# Patient Record
Sex: Female | Born: 1970 | Race: Black or African American | Hispanic: No | Smoking: Current some day smoker
Health system: Southern US, Community
[De-identification: ages and names within clinical notes are randomized; demographics above are authoritative.]

## PROBLEM LIST (undated history)

## (undated) DIAGNOSIS — K900 Celiac disease: Secondary | ICD-10-CM

## (undated) DIAGNOSIS — D66 Hereditary factor VIII deficiency: Secondary | ICD-10-CM

## (undated) DIAGNOSIS — M4800 Spinal stenosis, site unspecified: Secondary | ICD-10-CM

## (undated) HISTORY — DX: Hereditary factor VIII deficiency: D66

---

## 1998-02-10 ENCOUNTER — Emergency Department (HOSPITAL_COMMUNITY): Admission: EM | Admit: 1998-02-10 | Discharge: 1998-02-10 | Payer: Self-pay | Admitting: Emergency Medicine

## 1998-02-16 ENCOUNTER — Ambulatory Visit (HOSPITAL_COMMUNITY): Admission: RE | Admit: 1998-02-16 | Discharge: 1998-02-16 | Payer: Self-pay | Admitting: Obstetrics & Gynecology

## 1998-03-14 ENCOUNTER — Other Ambulatory Visit: Admission: RE | Admit: 1998-03-14 | Discharge: 1998-03-14 | Payer: Self-pay | Admitting: Obstetrics and Gynecology

## 1998-09-23 ENCOUNTER — Inpatient Hospital Stay (HOSPITAL_COMMUNITY): Admission: AD | Admit: 1998-09-23 | Discharge: 1998-09-23 | Payer: Self-pay | Admitting: Obstetrics and Gynecology

## 1998-09-30 ENCOUNTER — Inpatient Hospital Stay (HOSPITAL_COMMUNITY): Admission: AD | Admit: 1998-09-30 | Discharge: 1998-10-02 | Payer: Self-pay | Admitting: Obstetrics and Gynecology

## 2000-03-13 ENCOUNTER — Encounter: Admission: RE | Admit: 2000-03-13 | Discharge: 2000-03-13 | Payer: Self-pay | Admitting: Family Medicine

## 2000-03-13 ENCOUNTER — Encounter: Payer: Self-pay | Admitting: Family Medicine

## 2003-01-13 ENCOUNTER — Inpatient Hospital Stay (HOSPITAL_COMMUNITY): Admission: AD | Admit: 2003-01-13 | Discharge: 2003-01-13 | Payer: Self-pay | Admitting: Obstetrics and Gynecology

## 2003-02-25 ENCOUNTER — Encounter (INDEPENDENT_AMBULATORY_CARE_PROVIDER_SITE_OTHER): Payer: Self-pay

## 2003-02-25 ENCOUNTER — Inpatient Hospital Stay (HOSPITAL_COMMUNITY): Admission: AD | Admit: 2003-02-25 | Discharge: 2003-02-27 | Payer: Self-pay | Admitting: Obstetrics and Gynecology

## 2003-07-01 ENCOUNTER — Other Ambulatory Visit: Admission: RE | Admit: 2003-07-01 | Discharge: 2003-07-01 | Payer: Self-pay | Admitting: Family Medicine

## 2007-12-11 ENCOUNTER — Other Ambulatory Visit: Admission: RE | Admit: 2007-12-11 | Discharge: 2007-12-11 | Payer: Self-pay | Admitting: Family Medicine

## 2008-01-29 ENCOUNTER — Emergency Department (HOSPITAL_COMMUNITY): Admission: EM | Admit: 2008-01-29 | Discharge: 2008-01-29 | Payer: Self-pay | Admitting: Emergency Medicine

## 2008-10-28 ENCOUNTER — Other Ambulatory Visit: Admission: RE | Admit: 2008-10-28 | Discharge: 2008-10-28 | Payer: Self-pay | Admitting: Family Medicine

## 2008-11-12 ENCOUNTER — Encounter: Admission: RE | Admit: 2008-11-12 | Discharge: 2008-11-12 | Payer: Self-pay | Admitting: Family Medicine

## 2010-08-12 ENCOUNTER — Emergency Department (INDEPENDENT_AMBULATORY_CARE_PROVIDER_SITE_OTHER): Payer: 59

## 2010-08-12 ENCOUNTER — Emergency Department (HOSPITAL_BASED_OUTPATIENT_CLINIC_OR_DEPARTMENT_OTHER)
Admission: EM | Admit: 2010-08-12 | Discharge: 2010-08-12 | Disposition: A | Discharge status: Home / Self Care | Payer: 59 | Attending: Emergency Medicine | Admitting: Emergency Medicine

## 2010-08-12 DIAGNOSIS — M79609 Pain in unspecified limb: Secondary | ICD-10-CM | POA: Insufficient documentation

## 2010-08-12 DIAGNOSIS — S93609A Unspecified sprain of unspecified foot, initial encounter: Secondary | ICD-10-CM | POA: Insufficient documentation

## 2010-08-12 DIAGNOSIS — M25579 Pain in unspecified ankle and joints of unspecified foot: Secondary | ICD-10-CM

## 2010-08-12 DIAGNOSIS — Y929 Unspecified place or not applicable: Secondary | ICD-10-CM | POA: Insufficient documentation
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
## 2011-07-26 ENCOUNTER — Other Ambulatory Visit: Payer: Self-pay | Admitting: Family Medicine

## 2011-07-26 DIAGNOSIS — N946 Dysmenorrhea, unspecified: Secondary | ICD-10-CM

## 2011-07-26 DIAGNOSIS — R1032 Left lower quadrant pain: Secondary | ICD-10-CM

## 2011-08-01 ENCOUNTER — Other Ambulatory Visit: Payer: Self-pay | Admitting: Family Medicine

## 2011-08-01 DIAGNOSIS — Z1231 Encounter for screening mammogram for malignant neoplasm of breast: Secondary | ICD-10-CM

## 2011-08-07 ENCOUNTER — Ambulatory Visit: Payer: 59

## 2011-08-07 ENCOUNTER — Ambulatory Visit
Admission: RE | Admit: 2011-08-07 | Discharge: 2011-08-07 | Disposition: A | Discharge status: Home / Self Care | Payer: 59 | Source: Ambulatory Visit | Attending: Family Medicine | Admitting: Family Medicine

## 2011-08-07 DIAGNOSIS — Z1231 Encounter for screening mammogram for malignant neoplasm of breast: Secondary | ICD-10-CM

## 2011-08-13 ENCOUNTER — Other Ambulatory Visit: Payer: Self-pay | Admitting: Family Medicine

## 2011-08-13 DIAGNOSIS — R928 Other abnormal and inconclusive findings on diagnostic imaging of breast: Secondary | ICD-10-CM

## 2011-08-14 ENCOUNTER — Ambulatory Visit
Admission: RE | Admit: 2011-08-14 | Discharge: 2011-08-14 | Disposition: A | Discharge status: Home / Self Care | Payer: 59 | Source: Ambulatory Visit | Attending: Family Medicine | Admitting: Family Medicine

## 2011-08-14 DIAGNOSIS — N946 Dysmenorrhea, unspecified: Secondary | ICD-10-CM

## 2011-08-14 DIAGNOSIS — R1032 Left lower quadrant pain: Secondary | ICD-10-CM

## 2011-08-21 ENCOUNTER — Other Ambulatory Visit: Payer: 59

## 2011-08-28 ENCOUNTER — Ambulatory Visit
Admission: RE | Admit: 2011-08-28 | Discharge: 2011-08-28 | Disposition: A | Discharge status: Home / Self Care | Payer: 59 | Source: Ambulatory Visit | Attending: Family Medicine | Admitting: Family Medicine

## 2011-08-28 DIAGNOSIS — R928 Other abnormal and inconclusive findings on diagnostic imaging of breast: Secondary | ICD-10-CM

## 2012-08-05 ENCOUNTER — Telehealth: Payer: Self-pay | Admitting: Oncology

## 2012-08-05 NOTE — Telephone Encounter (Signed)
Left several vm to pt to call and set up a np appt. Referring office aware.

## 2012-08-07 ENCOUNTER — Telehealth: Payer: Self-pay | Admitting: Oncology

## 2012-08-07 NOTE — Telephone Encounter (Signed)
S/W PT IN REF TO NP APPT. 08/27/12@3 :00 REFERRING DR Tresa Res DX-FACTOR VIII NEED CLEARANCE FOR SURGERY MAILED NP PACKET

## 2012-08-12 ENCOUNTER — Telehealth: Payer: Self-pay | Admitting: Oncology

## 2012-08-12 NOTE — Telephone Encounter (Signed)
C/D 08/12/12 for appt. 08/27/12 °

## 2012-08-14 ENCOUNTER — Encounter: Payer: Self-pay | Admitting: Oncology

## 2012-08-14 ENCOUNTER — Other Ambulatory Visit: Payer: Self-pay | Admitting: Oncology

## 2012-08-14 DIAGNOSIS — D66 Hereditary factor VIII deficiency: Secondary | ICD-10-CM

## 2012-08-14 HISTORY — DX: Hereditary factor VIII deficiency: D66

## 2012-08-15 ENCOUNTER — Telehealth: Payer: Self-pay | Admitting: Oncology

## 2012-08-15 NOTE — Telephone Encounter (Signed)
New pt for 3/19 w/JG. JG sent order to have lb drawn 3/10. Not able to reach pt - # d/c. email to [facility] and Tiffany.

## 2012-08-18 ENCOUNTER — Other Ambulatory Visit: Payer: 59 | Admitting: Lab

## 2012-08-27 ENCOUNTER — Other Ambulatory Visit: Payer: 59 | Admitting: Lab

## 2012-08-27 ENCOUNTER — Ambulatory Visit: Payer: 59

## 2012-08-27 ENCOUNTER — Encounter: Payer: 59 | Admitting: Oncology

## 2012-09-05 ENCOUNTER — Encounter: Payer: Self-pay | Admitting: *Deleted

## 2012-09-05 ENCOUNTER — Telehealth: Payer: Self-pay | Admitting: *Deleted

## 2012-09-05 NOTE — Telephone Encounter (Signed)
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  Patient did not keep appointment and 30 day letter was mailed 08-08-12.  No patient response, will discharge from practice.

## 2012-10-13 ENCOUNTER — Encounter (HOSPITAL_BASED_OUTPATIENT_CLINIC_OR_DEPARTMENT_OTHER): Payer: Self-pay

## 2012-10-13 ENCOUNTER — Emergency Department (HOSPITAL_BASED_OUTPATIENT_CLINIC_OR_DEPARTMENT_OTHER)
Admission: EM | Admit: 2012-10-13 | Discharge: 2012-10-13 | Disposition: A | Discharge status: Home / Self Care | Payer: 59 | Attending: Emergency Medicine | Admitting: Emergency Medicine

## 2012-10-13 DIAGNOSIS — R05 Cough: Secondary | ICD-10-CM | POA: Insufficient documentation

## 2012-10-13 DIAGNOSIS — R0602 Shortness of breath: Secondary | ICD-10-CM | POA: Insufficient documentation

## 2012-10-13 DIAGNOSIS — R51 Headache: Secondary | ICD-10-CM | POA: Insufficient documentation

## 2012-10-13 DIAGNOSIS — R52 Pain, unspecified: Secondary | ICD-10-CM | POA: Insufficient documentation

## 2012-10-13 DIAGNOSIS — J029 Acute pharyngitis, unspecified: Secondary | ICD-10-CM | POA: Insufficient documentation

## 2012-10-13 DIAGNOSIS — R059 Cough, unspecified: Secondary | ICD-10-CM | POA: Insufficient documentation

## 2012-10-13 DIAGNOSIS — R197 Diarrhea, unspecified: Secondary | ICD-10-CM | POA: Insufficient documentation

## 2012-10-13 DIAGNOSIS — R6883 Chills (without fever): Secondary | ICD-10-CM | POA: Insufficient documentation

## 2012-10-13 DIAGNOSIS — R21 Rash and other nonspecific skin eruption: Secondary | ICD-10-CM | POA: Insufficient documentation

## 2012-10-13 DIAGNOSIS — D66 Hereditary factor VIII deficiency: Secondary | ICD-10-CM | POA: Insufficient documentation

## 2012-10-13 DIAGNOSIS — R110 Nausea: Secondary | ICD-10-CM | POA: Insufficient documentation

## 2012-10-13 DIAGNOSIS — J3489 Other specified disorders of nose and nasal sinuses: Secondary | ICD-10-CM | POA: Insufficient documentation

## 2012-10-13 DIAGNOSIS — Z9189 Other specified personal risk factors, not elsewhere classified: Secondary | ICD-10-CM

## 2012-10-13 DIAGNOSIS — M549 Dorsalgia, unspecified: Secondary | ICD-10-CM | POA: Insufficient documentation

## 2012-10-13 MED ORDER — DOXYCYCLINE HYCLATE 100 MG PO CAPS: 100.0000 mg | ORAL_CAPSULE | Freq: Two times a day (BID) | ORAL | Status: DC

## 2012-10-13 MED ORDER — DOXYCYCLINE HYCLATE 100 MG PO TABS
ORAL_TABLET | ORAL | Status: AC
  Administered 2012-10-13: 100 mg via ORAL
  Filled 2012-10-13: qty 1

## 2012-10-13 MED ORDER — NAPROXEN 500 MG PO TABS: 500.0000 mg | ORAL_TABLET | Freq: Two times a day (BID) | ORAL | Status: DC

## 2012-10-13 MED ORDER — DOXYCYCLINE HYCLATE 100 MG PO TABS
100.0000 mg | ORAL_TABLET | Freq: Once | ORAL | Status: AC
  Administered 2012-10-13: 100 mg via ORAL

## 2012-10-13 NOTE — ED Provider Notes (Signed)
History  This chart was scribed for Courtney Jakes, MD by Shari Heritage, ED Scribe. The patient was seen in room MH03/MH03. Patient's care was started at 1905.   CSN: [id number]  Arrival date & time 10/13/12  1749   First MD Initiated Contact with Patient 10/13/12 1905      Chief Complaint  Patient presents with  . Rash  . Back Pain  . Generalized Body Aches    Patient is a 42 y.o. female presenting with rash. The history is provided by the patient. No language interpreter was used.  Rash Location:  Shoulder/arm Shoulder/arm rash location:  L forearm Quality: itchiness   Context: insect bite/sting   Associated symptoms: diarrhea, headaches, joint pain, myalgias, nausea and shortness of breath   Associated symptoms: no abdominal pain, no fever, no sore throat and not vomiting   Myalgias:    Location:  Generalized   Quality:  Aching   Duration:  2 weeks   Timing:  Constant    HPI Comments: Courtney Warren is a 42 y.o. female who presents to the Emergency Department complaining of generalized body aches and back pain for the past 2 weeks. Patient states that she removed a tick from her left arm about 4 weeks ago at most. She says that the tick appeared while she was camping locally. She states that 1 week following, she started having cold symptoms including congestion, sore throat and productive cough. Patient states that cough lingered and she starting having chills. She says that today she developed itching to her left arm at the site of tick removal. Patient's other symptoms include headaches, diarrhea, nausea and shortness of breath. Patient denies fever, visual changes, vomiting, abdominal pain, chest pain, blood in stool, dysuria, hematuria, no other rash, confusion or hx of bleeding easily. Patient does not smoke or use alcohol.    PCP - Mady Gemma   Past Medical History  Diagnosis Date  . 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 No pertinent past  surgical history.  No family history on file.  History  Substance Use Topics  . Smoking status: Never Smoker   . Smokeless tobacco: Not on file  . Alcohol Use: No    OB History   Grav Para Term Preterm Abortions TAB SAB Ect Mult Living                  Review of Systems  Constitutional: Positive for chills. Negative for fever.  HENT: Positive for congestion and rhinorrhea. Negative for sore throat and neck pain.   Eyes: Negative for visual disturbance.  Respiratory: Positive for cough and shortness of breath.   Cardiovascular: Negative for chest pain and leg swelling.  Gastrointestinal: Positive for nausea and diarrhea. Negative for vomiting, abdominal pain and blood in stool.  Genitourinary: Negative for dysuria and hematuria.  Musculoskeletal: Positive for myalgias, back pain and arthralgias.  Skin: Positive for rash.  Neurological: Positive for headaches.  Hematological: Does not bruise/bleed easily.  Psychiatric/Behavioral: Negative for confusion.    Allergies  Review of patient's allergies indicates no known allergies.  Home Medications   Current Outpatient Rx  Name  Route  Sig  Dispense  Refill  . doxycycline (VIBRAMYCIN) 100 MG capsule   Oral   Take 1 capsule (100 mg total) by mouth 2 (two) times daily.   28 capsule   0   . naproxen (NAPROSYN) 500 MG tablet   Oral   Take 1 tablet (500 mg total) by  mouth 2 (two) times daily.   14 tablet   0     Triage Vitals: BP 135/63  Pulse 69  Temp(Src) 98.4 F (36.9 C) (Oral)  Resp 20  Ht 5\' 11"  (1.803 m)  Wt 210 lb (95.255 kg)  BMI 29.3 kg/m2  SpO2 97%  LMP 10/13/2012  Physical Exam  Constitutional: She is oriented to person, place, and time. She appears well-developed and well-nourished.  HENT:  Head: Normocephalic and atraumatic.  Eyes: Conjunctivae and EOM are normal. Pupils are equal, round, and reactive to light.  Neck: Normal range of motion. Neck supple.  Cardiovascular: Normal rate, regular  rhythm and normal heart sounds.   No murmur heard. Pulmonary/Chest: Effort normal and breath sounds normal. No respiratory distress. She has no wheezes. She has no rales.  Abdominal: Soft. Bowel sounds are normal. There is no tenderness.  Musculoskeletal: Normal range of motion.  Neurological: She is alert and oriented to person, place, and time.  Skin: Skin is warm and dry.    ED Course  Procedures (including critical care time) DIAGNOSTIC STUDIES: Oxygen Saturation is 97% on room air, adequate by my interpretation.    COORDINATION OF CARE: 7:26 PM- Patient informed of current plan for treatment and evaluation and agrees with plan at this time.      Labs Reviewed - No data to display No results found.   1. At high risk for tick borne illness       MDM  Patient symptoms not inconsistent with Lyme's disease. Patient did have a tick exposure approximately 4 weeks or less ago. Patient still has the area of erythema and induration with a tick bit her on the inner part of her left arm. There is no classic erythema migrans rash at this point in time. Patient did not know what in any particular time. However her bodyaches and flulike symptoms could be consistent with Lyme's disease. It also sounds as if she did develop an upper respiratory infection shortly after the tick bite that would be a separate problem. Will initiate patient with treatment of doxycycline and followup with her primary care Dr.      I personally performed the services described in this documentation, which was scribed in my presence. The recorded information has been reviewed and is accurate.     Courtney Jakes, MD 10/13/12 [phone]

## 2012-10-13 NOTE — ED Notes (Addendum)
Pt reports generalized body aches, back pain x 2 weeks.  States she removed a tick from her left arm 4 weeks ago.

## 2013-06-11 DIAGNOSIS — K900 Celiac disease: Secondary | ICD-10-CM

## 2013-06-11 HISTORY — DX: Celiac disease: K90.0

## 2013-12-15 ENCOUNTER — Emergency Department (HOSPITAL_BASED_OUTPATIENT_CLINIC_OR_DEPARTMENT_OTHER)
Admission: EM | Admit: 2013-12-15 | Discharge: 2013-12-16 | Disposition: A | Discharge status: Home / Self Care | Payer: 59 | Attending: Emergency Medicine | Admitting: Emergency Medicine

## 2013-12-15 ENCOUNTER — Encounter (HOSPITAL_BASED_OUTPATIENT_CLINIC_OR_DEPARTMENT_OTHER): Payer: Self-pay | Admitting: Emergency Medicine

## 2013-12-15 DIAGNOSIS — N3000 Acute cystitis without hematuria: Secondary | ICD-10-CM | POA: Insufficient documentation

## 2013-12-15 DIAGNOSIS — Z791 Long term (current) use of non-steroidal anti-inflammatories (NSAID): Secondary | ICD-10-CM | POA: Insufficient documentation

## 2013-12-15 DIAGNOSIS — D66 Hereditary factor VIII deficiency: Secondary | ICD-10-CM | POA: Insufficient documentation

## 2013-12-15 DIAGNOSIS — Z792 Long term (current) use of antibiotics: Secondary | ICD-10-CM | POA: Insufficient documentation

## 2013-12-15 DIAGNOSIS — F17200 Nicotine dependence, unspecified, uncomplicated: Secondary | ICD-10-CM | POA: Insufficient documentation

## 2013-12-15 LAB — URINALYSIS, ROUTINE W REFLEX MICROSCOPIC
BILIRUBIN URINE: NEGATIVE
Glucose, UA: NEGATIVE mg/dL
Ketones, ur: NEGATIVE mg/dL
Nitrite: POSITIVE — AB
PROTEIN: 100 mg/dL — AB
Specific Gravity, Urine: 1.011 (ref 1.005–1.030)
UROBILINOGEN UA: 1 mg/dL (ref 0.0–1.0)
pH: 6 (ref 5.0–8.0)

## 2013-12-15 LAB — URINE MICROSCOPIC-ADD ON

## 2013-12-15 LAB — PREGNANCY, URINE: Preg Test, Ur: NEGATIVE

## 2013-12-15 NOTE — ED Notes (Signed)
Urinary sxs and back pain since yesterday, denies fever

## 2013-12-16 MED ORDER — CIPROFLOXACIN HCL 500 MG PO TABS
ORAL_TABLET | ORAL | Status: AC
  Filled 2013-12-16: qty 1

## 2013-12-16 MED ORDER — PHENAZOPYRIDINE HCL 100 MG PO TABS
ORAL_TABLET | ORAL | Status: AC
  Filled 2013-12-16: qty 2

## 2013-12-16 MED ORDER — CIPROFLOXACIN HCL 500 MG PO TABS: 500.0000 mg | ORAL_TABLET | Freq: Two times a day (BID) | ORAL | Status: DC

## 2013-12-16 MED ORDER — CIPROFLOXACIN HCL 500 MG PO TABS
500.0000 mg | ORAL_TABLET | Freq: Once | ORAL | Status: AC
  Administered 2013-12-16: 500 mg via ORAL

## 2013-12-16 MED ORDER — PHENAZOPYRIDINE HCL 200 MG PO TABS: 200.0000 mg | ORAL_TABLET | Freq: Three times a day (TID) | ORAL | Status: DC

## 2013-12-16 MED ORDER — PHENAZOPYRIDINE HCL 100 MG PO TABS
200.0000 mg | ORAL_TABLET | Freq: Once | ORAL | Status: AC
  Administered 2013-12-16: 200 mg via ORAL

## 2013-12-16 NOTE — Discharge Instructions (Signed)
Urinary Tract Infection Urinary tract infections (UTIs) can develop anywhere along your urinary tract. Your urinary tract is your body's drainage system for removing wastes and extra water. Your urinary tract includes two kidneys, two ureters, a bladder, and a urethra. Your kidneys are a pair of bean-shaped organs. Each kidney is about the size of your fist. They are located below your ribs, one on each side of your spine. CAUSES Infections are caused by microbes, which are microscopic organisms, including fungi, viruses, and bacteria. These organisms are so small that they can only be seen through a microscope. Bacteria are the microbes that most commonly cause UTIs. SYMPTOMS  Symptoms of UTIs may vary by age and gender of the patient and by the location of the infection. Symptoms in young women typically include a frequent and intense urge to urinate and a painful, burning feeling in the bladder or urethra during urination. Older women and men are more likely to be tired, shaky, and weak and have muscle aches and abdominal pain. A fever may mean the infection is in your kidneys. Other symptoms of a kidney infection include pain in your back or sides below the ribs, nausea, and vomiting. DIAGNOSIS To diagnose a UTI, your caregiver will ask you about your symptoms. Your caregiver also will ask to provide a urine sample. The urine sample will be tested for bacteria and white blood cells. White blood cells are made by your body to help fight infection. TREATMENT  Typically, UTIs can be treated with medication. Because most UTIs are caused by a bacterial infection, they usually can be treated with the use of antibiotics. The choice of antibiotic and length of treatment depend on your symptoms and the type of bacteria causing your infection. HOME CARE INSTRUCTIONS  If you were prescribed antibiotics, take them exactly as your caregiver instructs you. Finish the medication even if you feel better after you  have only taken some of the medication.  Drink enough water and fluids to keep your urine clear or pale yellow.  Avoid caffeine, tea, and carbonated beverages. They tend to irritate your bladder.  Empty your bladder often. Avoid holding urine for long periods of time.  Empty your bladder before and after sexual intercourse.  After a bowel movement, women should cleanse from front to back. Use each tissue only once.  SEEK IMMEDIATE MEDICAL CARE IF:   You have severe back pain or lower abdominal pain.  You develop chills.  You have nausea or vomiting.  You have continued burning or discomfort with urination. MAKE SURE YOU:   Understand these instructions.  Will watch your condition.  Will get help right away if you are not doing well or get worse. Document Released: 03/07/2005 Document Revised: 11/27/2011 Document Reviewed: 07/06/2011 [facility] Patient Information 2015 [place], [place]. This information is not intended to replace advice given to you by your health care provider. Make sure you discuss any questions you have with your health care provider.

## 2013-12-16 NOTE — ED Provider Notes (Addendum)
CSN: [id number]     Arrival date & time 12/15/13  2251 History   First MD Initiated Contact with Patient 12/16/13 0046     Chief Complaint  Patient presents with  . Urinary Frequency     (Consider location/radiation/quality/duration/timing/severity/associated sxs/prior Treatment) HPI This is a 43 year old female with a two-day history of urinary urgency, urinary frequency, voiding small amounts and burning with urination. Symptoms are moderate but worsening. She has not gotten relief with over-the-counter ASO. Symptoms have been associated with chills but no fever. She denies nausea, vomiting or diarrhea. She is having back pain and pain in her flanks, right greater than left. She is only had one urinary tract infection in the past but states this is similar.  Past Medical History  Diagnosis Date  . 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 No pertinent past surgical history. History reviewed. No pertinent family history. History  Substance Use Topics  . Smoking status: Current Some Day Smoker    Types: Cigars  . Smokeless tobacco: Not on file  . Alcohol Use: No   OB History   Grav Para Term Preterm Abortions TAB SAB Ect Mult Living                 Review of Systems  All other systems reviewed and are negative.   Allergies  Review of patient's allergies indicates no known allergies.  Home Medications   Prior to Admission medications   Medication Sig Start Date End Date Taking? Authorizing Provider  etonogestrel-ethinyl estradiol (NUVARING) 0.12-0.015 MG/24HR vaginal ring Place 1 each vaginally every 28 (twenty-eight) days. Insert vaginally and leave in place for 3 consecutive weeks, then remove for 1 week.   Yes Historical Provider, MD  doxycycline (VIBRAMYCIN) 100 MG capsule Take 1 capsule (100 mg total) by mouth 2 (two) times daily. 10/13/12   Vanetta MuldersScott Zackowski, MD  naproxen (NAPROSYN) 500 MG tablet Take 1 tablet (500 mg total) by mouth 2 (two) times daily. 10/13/12    Vanetta MuldersScott Zackowski, MD   BP 118/66  Pulse 76  Temp(Src) 98.6 F (37 C) (Oral)  Resp 20  Ht 5\' 10"  (1.778 m)  Wt 195 lb (88.451 kg)  BMI 27.98 kg/m2  SpO2 98%  LMP 11/15/2013  Physical Exam General: Well-developed, well-nourished female in no acute distress; appearance consistent with age of record HENT: normocephalic; atraumatic Eyes: pupils equal, round and reactive to light; extraocular muscles intact Neck: supple Heart: regular rate and rhythm Lungs: clear to auscultation bilaterally Abdomen: soft; nondistended; suprapubic tenderness; no masses or hepatosplenomegaly; bowel sounds present GU: CVA tenderness right greater than left Extremities: No deformity; full range of motion; pulses normal Neurologic: Awake, alert and oriented; motor function intact in all extremities and symmetric; no facial droop Skin: Warm and dry Psychiatric: Normal mood and affect    ED Course  Procedures (including critical care time)  MDM   Nursing notes and vitals signs, including pulse oximetry, reviewed.  Summary of this visit's results, reviewed by myself:  Labs:  Results for orders placed during the hospital encounter of 12/15/13 (from the past 24 hour(s))  URINALYSIS, ROUTINE W REFLEX MICROSCOPIC     Status: Abnormal   Collection Time    12/15/13 11:17 PM      Result Value Ref Range   Color, Urine ORANGE (*) YELLOW   APPearance TURBID (*) CLEAR   Specific Gravity, Urine 1.011  1.005 - 1.030   pH 6.0  5.0 - 8.0   Glucose, UA NEGATIVE  NEGATIVE  mg/dL   Hgb urine dipstick MODERATE (*) NEGATIVE   Bilirubin Urine NEGATIVE  NEGATIVE   Ketones, ur NEGATIVE  NEGATIVE mg/dL   Protein, ur 161100 (*) NEGATIVE mg/dL   Urobilinogen, UA 1.0  0.0 - 1.0 mg/dL   Nitrite POSITIVE (*) NEGATIVE   Leukocytes, UA LARGE (*) NEGATIVE  PREGNANCY, URINE     Status: None   Collection Time    12/15/13 11:17 PM      Result Value Ref Range   Preg Test, Ur NEGATIVE  NEGATIVE  URINE MICROSCOPIC-ADD ON      Status: Abnormal   Collection Time    12/15/13 11:17 PM      Result Value Ref Range   Squamous Epithelial / LPF RARE  RARE   WBC, UA TOO NUMEROUS TO COUNT  <3 WBC/hpf   RBC / HPF 3-6  <3 RBC/hpf   Bacteria, UA MANY (*) RARE   Urine-Other MUCOUS PRESENT     Will treat for early pyelonephritis.     Hanley SeamenJohn L August Gosser, MD 12/16/13 [id number]  Hanley SeamenJohn L Genesia Caslin, MD 12/16/13 [id number]

## 2013-12-18 LAB — URINE CULTURE

## 2015-06-12 DIAGNOSIS — M4800 Spinal stenosis, site unspecified: Secondary | ICD-10-CM

## 2015-06-12 HISTORY — DX: Spinal stenosis, site unspecified: M48.00

## 2016-02-26 ENCOUNTER — Encounter (HOSPITAL_BASED_OUTPATIENT_CLINIC_OR_DEPARTMENT_OTHER): Payer: Self-pay | Admitting: *Deleted

## 2016-02-26 ENCOUNTER — Emergency Department (HOSPITAL_BASED_OUTPATIENT_CLINIC_OR_DEPARTMENT_OTHER): Payer: Managed Care, Other (non HMO)

## 2016-02-26 ENCOUNTER — Emergency Department (HOSPITAL_BASED_OUTPATIENT_CLINIC_OR_DEPARTMENT_OTHER)
Admission: EM | Admit: 2016-02-26 | Discharge: 2016-02-26 | Disposition: A | Discharge status: Home / Self Care | Payer: Managed Care, Other (non HMO) | Attending: Emergency Medicine | Admitting: Emergency Medicine

## 2016-02-26 DIAGNOSIS — Y9389 Activity, other specified: Secondary | ICD-10-CM | POA: Insufficient documentation

## 2016-02-26 DIAGNOSIS — Z79899 Other long term (current) drug therapy: Secondary | ICD-10-CM | POA: Insufficient documentation

## 2016-02-26 DIAGNOSIS — Z79891 Long term (current) use of opiate analgesic: Secondary | ICD-10-CM | POA: Diagnosis not present

## 2016-02-26 DIAGNOSIS — Y92019 Unspecified place in single-family (private) house as the place of occurrence of the external cause: Secondary | ICD-10-CM | POA: Insufficient documentation

## 2016-02-26 DIAGNOSIS — W010XXA Fall on same level from slipping, tripping and stumbling without subsequent striking against object, initial encounter: Secondary | ICD-10-CM | POA: Diagnosis not present

## 2016-02-26 DIAGNOSIS — Y999 Unspecified external cause status: Secondary | ICD-10-CM | POA: Diagnosis not present

## 2016-02-26 DIAGNOSIS — F17290 Nicotine dependence, other tobacco product, uncomplicated: Secondary | ICD-10-CM | POA: Diagnosis not present

## 2016-02-26 DIAGNOSIS — S8391XA Sprain of unspecified site of right knee, initial encounter: Secondary | ICD-10-CM | POA: Insufficient documentation

## 2016-02-26 DIAGNOSIS — S8991XA Unspecified injury of right lower leg, initial encounter: Secondary | ICD-10-CM | POA: Diagnosis present

## 2016-02-26 HISTORY — DX: Celiac disease: K90.0

## 2016-02-26 HISTORY — DX: Spinal stenosis, site unspecified: M48.00

## 2016-02-26 MED ORDER — OXYCODONE-ACETAMINOPHEN 5-325 MG PO TABS
1.0000 | ORAL_TABLET | Freq: Once | ORAL | Status: AC
  Administered 2016-02-26: 1 via ORAL
  Filled 2016-02-26: qty 1

## 2016-02-26 NOTE — Discharge Instructions (Signed)
Rest, ice and elevated your knee. Wear a knee immobilizer as needed for comfort. Use crutches at need for comfort. Take NSAIDS and tylenol as needed for pain. Please follow up with your primary care doctor if symptoms worsen. Return to the ED if your leg becomes cold, you lose feeling, or the swelling increases.

## 2016-02-26 NOTE — ED Provider Notes (Signed)
[facility]   CSN: [id number] Arrival date & time: 02/26/16  4098     History   Chief Complaint Chief Complaint  Patient presents with  . Knee Injury    HPI Courtney Warren is a 45 y.o. female.  45 year old African-American female no significant past medical history presents to the ED this morning with right knee pain. Patient states that she was painting her house on Friday when she tripped on the plastic and cord and fell onto both knees. Patient has been ambulating since with pain. However today the swelling increased and she was unable to bend her right knee. She has tried Aleve and Tylenol with little relief. Walking makes the pain worse. Nothing makes better. Denies any pain in right hip or right ankle. Denies any other complaints.      Past Medical History:  Diagnosis Date  . Celiac disease 2015  . 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 Spinal stenosis 2017    Patient Active Problem List   Diagnosis Date Noted  . 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 No pertinent surgical history.  OB History    No data available       Home Medications    Prior to Admission medications   Medication Sig Start Date End Date Taking? Authorizing Provider  naproxen (NAPROSYN) 500 MG tablet Take 1 tablet (500 mg total) by mouth 2 (two) times daily. 10/13/12  Yes Vanetta Mulders, MD  traMADol (ULTRAM) 50 MG tablet Take 50 mg by mouth every 6 (six) hours as needed.   Yes Historical Provider, MD  ciprofloxacin (CIPRO) 500 MG tablet Take 1 tablet (500 mg total) by mouth 2 (two) times daily. 12/16/13   John Molpus, MD  doxycycline (VIBRAMYCIN) 100 MG capsule Take 1 capsule (100 mg total) by mouth 2 (two) times daily. 10/13/12   Vanetta Mulders, MD  etonogestrel-ethinyl estradiol (NUVARING) 0.12-0.015 MG/24HR vaginal ring Place 1 each vaginally every 28 (twenty-eight) days. Insert vaginally and leave in place for 3 consecutive weeks, then  remove for 1 week.    Historical Provider, MD  phenazopyridine (PYRIDIUM) 200 MG tablet Take 1 tablet (200 mg total) by mouth 3 (three) times daily. 12/16/13   Paula Libra, MD    Family History No family history on file.  Social History Social History  Substance Use Topics  . Smoking status: Current Some Day Smoker    Types: Cigars  . Smokeless tobacco: Never Used  . Alcohol use Yes     Comment: weekly     Allergies   Review of patient's allergies indicates no known allergies.   Review of Systems Review of Systems  Constitutional: Negative for chills and fever.  Respiratory: Negative for cough and shortness of breath.   Cardiovascular: Negative for chest pain, palpitations and leg swelling.  Gastrointestinal: Negative for abdominal pain, diarrhea, nausea and vomiting.  Genitourinary: Negative for flank pain, frequency and urgency.  Musculoskeletal: Positive for joint swelling.  Skin: Negative for color change, rash and wound.  Neurological: Negative for dizziness, weakness, light-headedness, numbness and headaches.     Physical Exam Updated Vital Signs BP 125/82 (BP Location: Left Arm)   Pulse 75   Temp 98.6 F (37 C) (Oral)   Resp 18   Ht 5\' 10"  (1.778 m)   Wt 98.4 kg   LMP 02/21/2016 (Exact Date)   SpO2 100%   BMI 31.14 kg/m   Physical Exam  Constitutional: She appears well-developed and well-nourished. No  distress.  Eyes: Right eye exhibits no discharge. Left eye exhibits no discharge. No scleral icterus.  Cardiovascular:  Pulses:      Dorsalis pedis pulses are 2+ on the right side, and 2+ on the left side.  Pulmonary/Chest: No respiratory distress.  Musculoskeletal:       Right knee: She exhibits decreased range of motion and swelling. She exhibits no effusion, no ecchymosis, no deformity, no laceration, no erythema, no LCL laxity, normal patellar mobility and normal meniscus. Tenderness found. Patellar tendon tenderness noted.  Neurological: She is alert.  No sensory deficit.  Skin: Skin is warm and dry. Capillary refill takes less than 2 seconds. No erythema. No pallor.  Nursing note and vitals reviewed.    ED Treatments / Results  Labs (all labs ordered are listed, but only abnormal results are displayed) Labs Reviewed - No data to display  EKG  EKG Interpretation None       Radiology Dg Knee Complete 4 Views Right  Result Date: 02/26/2016 CLINICAL DATA:  Pt states she was painting this past Friday when she tripped. Reports falling on hands and knees. Presents with RIGHT anterior patellar knee pain. Reports some numbness/tingling. Able to ambulate with pain. Reports she can kick l.*comment was truncated* EXAM: RIGHT KNEE - COMPLETE 4+ VIEW COMPARISON:  None FINDINGS: No fracture of the proximal tibia or distal femur. Patella is normal. No joint effusion. IMPRESSION: No fracture or dislocation.  For Electronically Signed   By: Genevive BiStewart  Edmunds M.D.   On: 02/26/2016 10:49    Procedures Procedures (including critical care time)  Medications Ordered in ED Medications  oxyCODONE-acetaminophen (PERCOCET/ROXICET) 5-325 MG per tablet 1 tablet (not administered)     Initial Impression / Assessment and Plan / ED Course  I have reviewed the triage vital signs and the nursing notes.  Pertinent labs & imaging results that were available during my care of the patient were reviewed by me and considered in my medical decision making (see chart for details).  Clinical Course  Patient X-Ray negative for obvious fracture or dislocation. Pain managed in ED. Pt advised to follow up with orthopedics if symptoms persist for possibility of missed fracture diagnosis or ligament injury. Patient given brace while in ED, conservative therapy recommended and discussed. Patient will be dc home & is agreeable with above plan. Hemodynamically stable. Discharged home in NAD with stable VS. Strict return precautions given including education on compartment  syndrome.  Final Clinical Impressions(s) / ED Diagnoses   Final diagnoses:  Right knee sprain, initial encounter    New Prescriptions Discharge Medication List as of 02/26/2016 11:22 AM       Rise MuKenneth T Leaphart, PA-C 02/27/16 [phone]    Marily MemosJason Mesner, MD 02/27/16 1049

## 2016-02-26 NOTE — ED Triage Notes (Signed)
Pt reports she was painting this past Friday (indoors) when she tripped. Reports falling on hands and knees. Presents with R knee pain. Reports some numbness/tingling (pedal pulse intact; cap refill <2secs). Able to ambulate with pain. Reports she can kick leg forward but is unable to bend back.

## 2017-05-17 IMAGING — DX DG KNEE COMPLETE 4+V*R*
4 series · 4 of 4 positions shown · non-contrast
Comparison: None

CLINICAL DATA: Pt states she was painting this past [REDACTED] when she
tripped. Reports falling on hands and knees. Presents with RIGHT
anterior patellar knee pain. Reports some numbness/tingling. Able to
ambulate with pain. Reports she can Carl-Albin Murul...*comment was truncated*

EXAM:
RIGHT KNEE - COMPLETE 4+ VIEW

[knee ap]
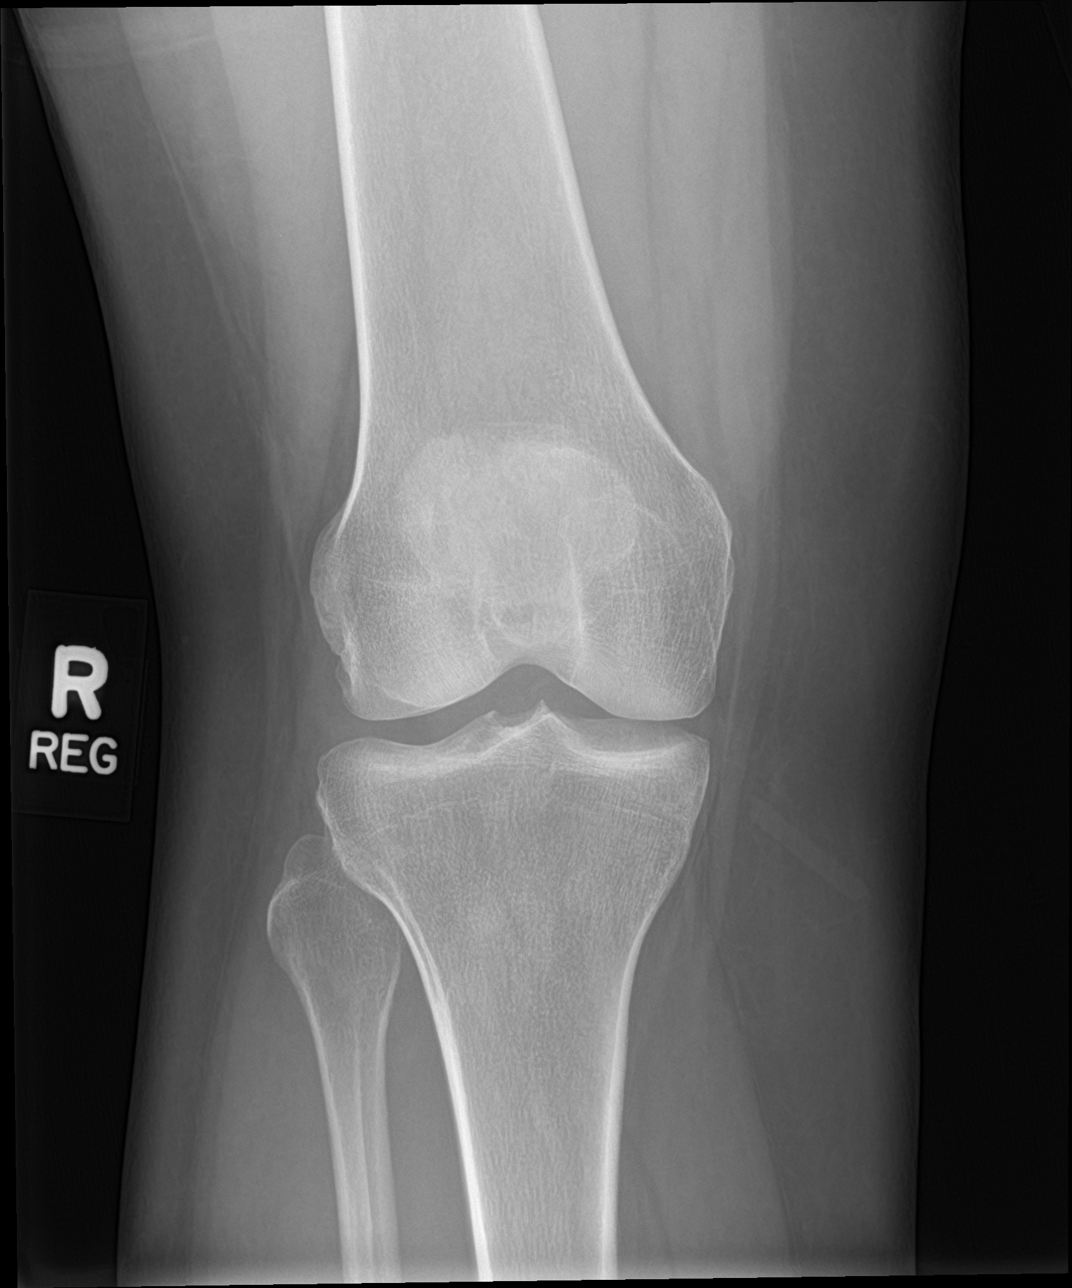

[knee lat]
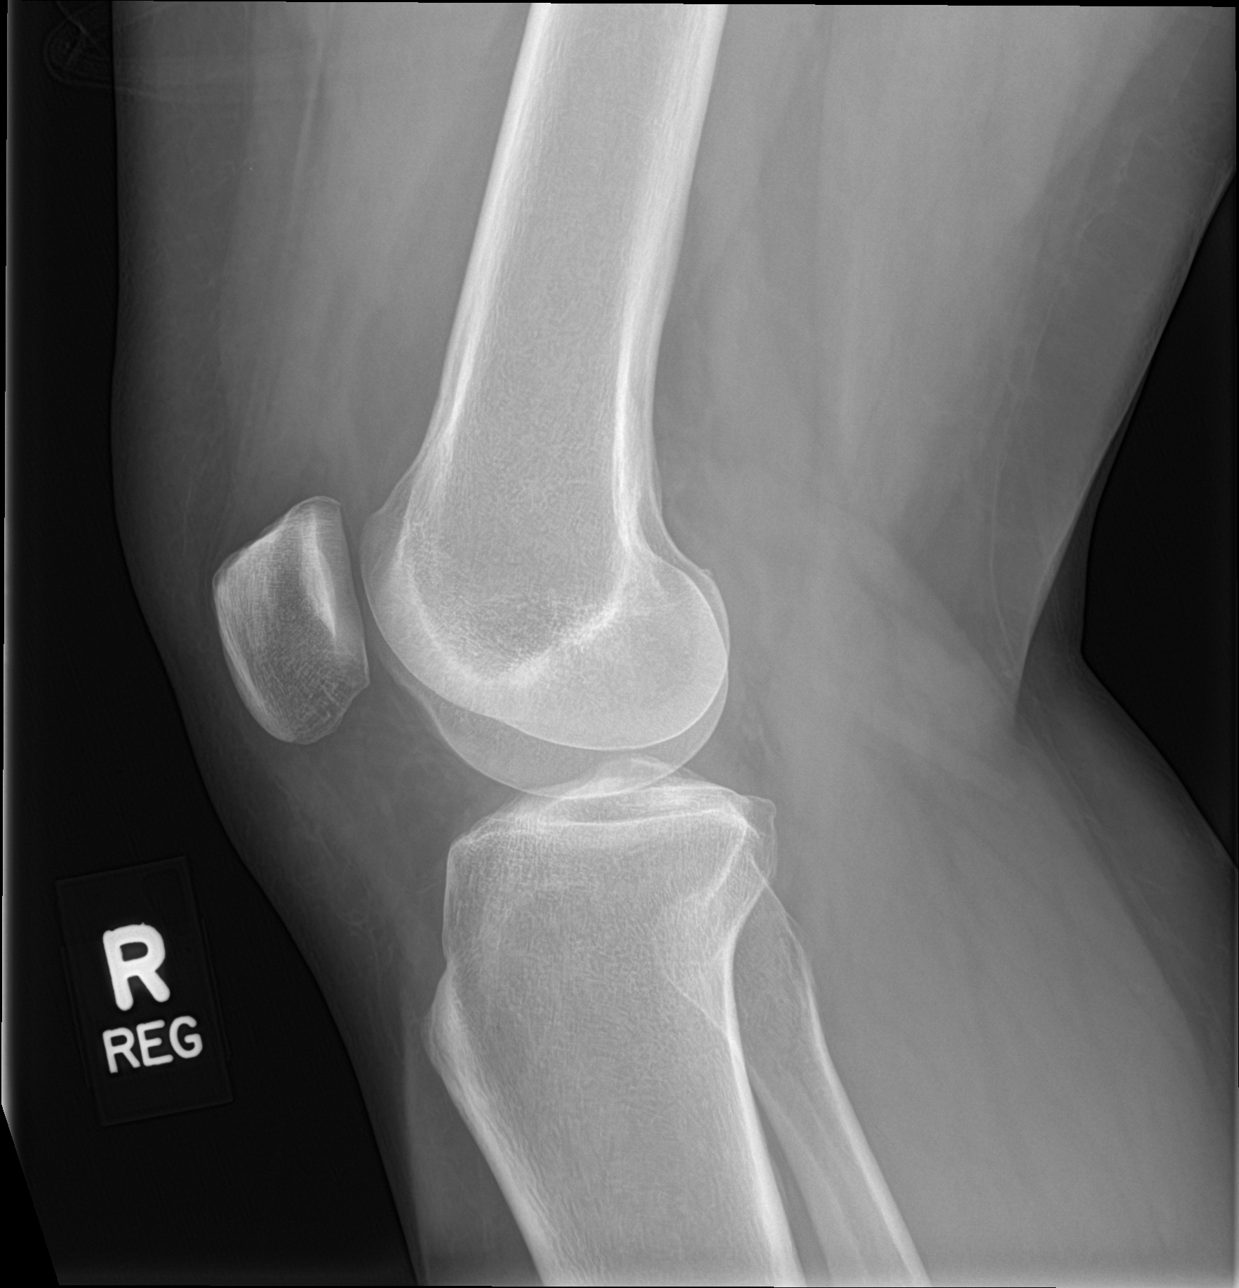

[knee obl (1 of 2)]
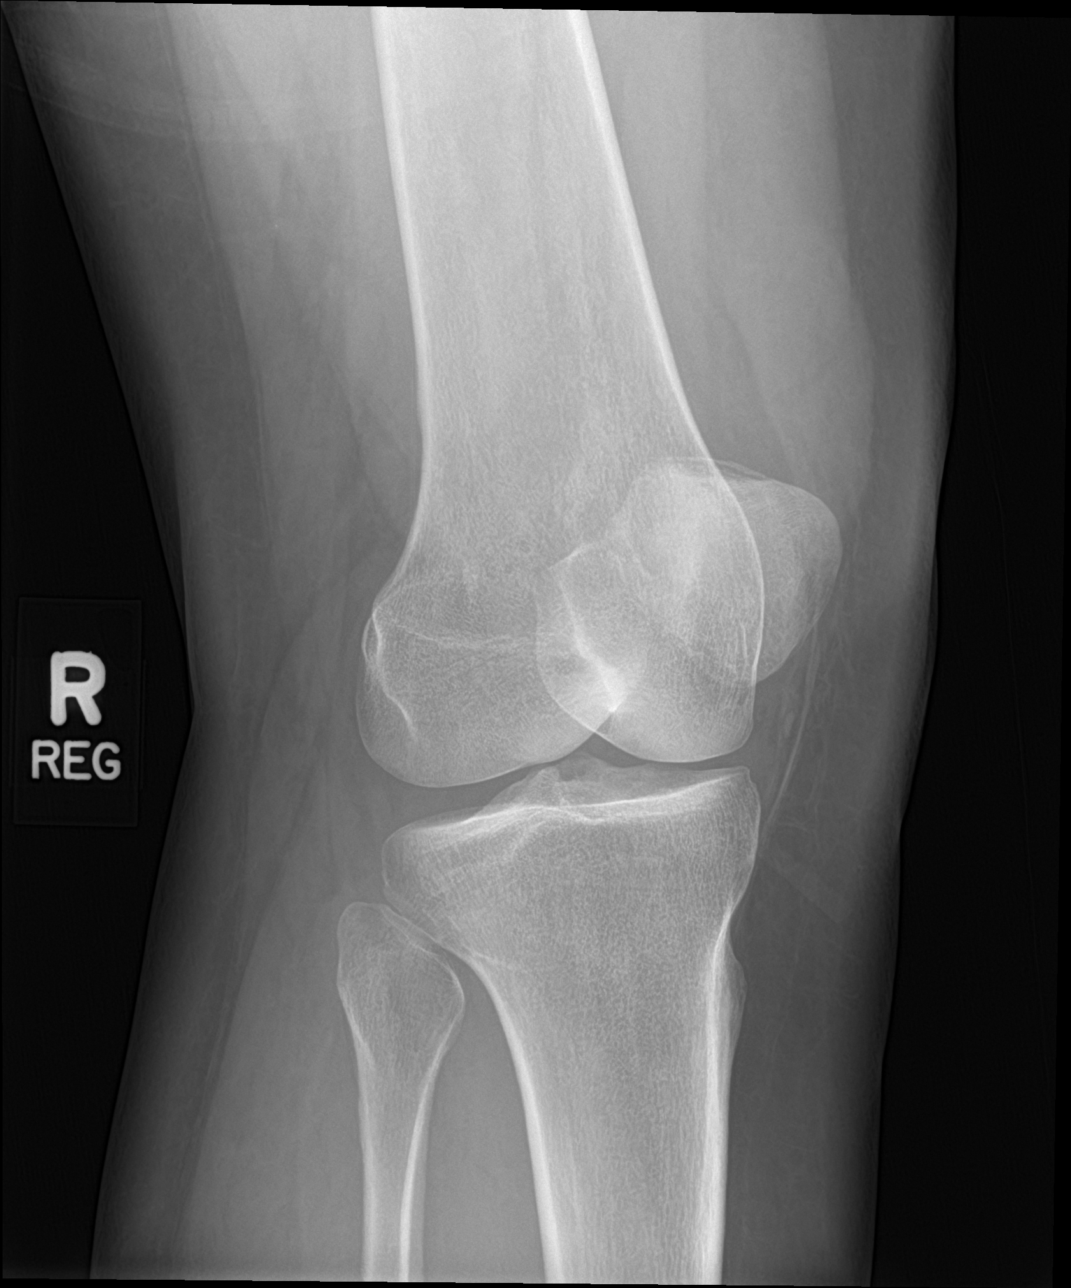

[knee obl (2 of 2)]
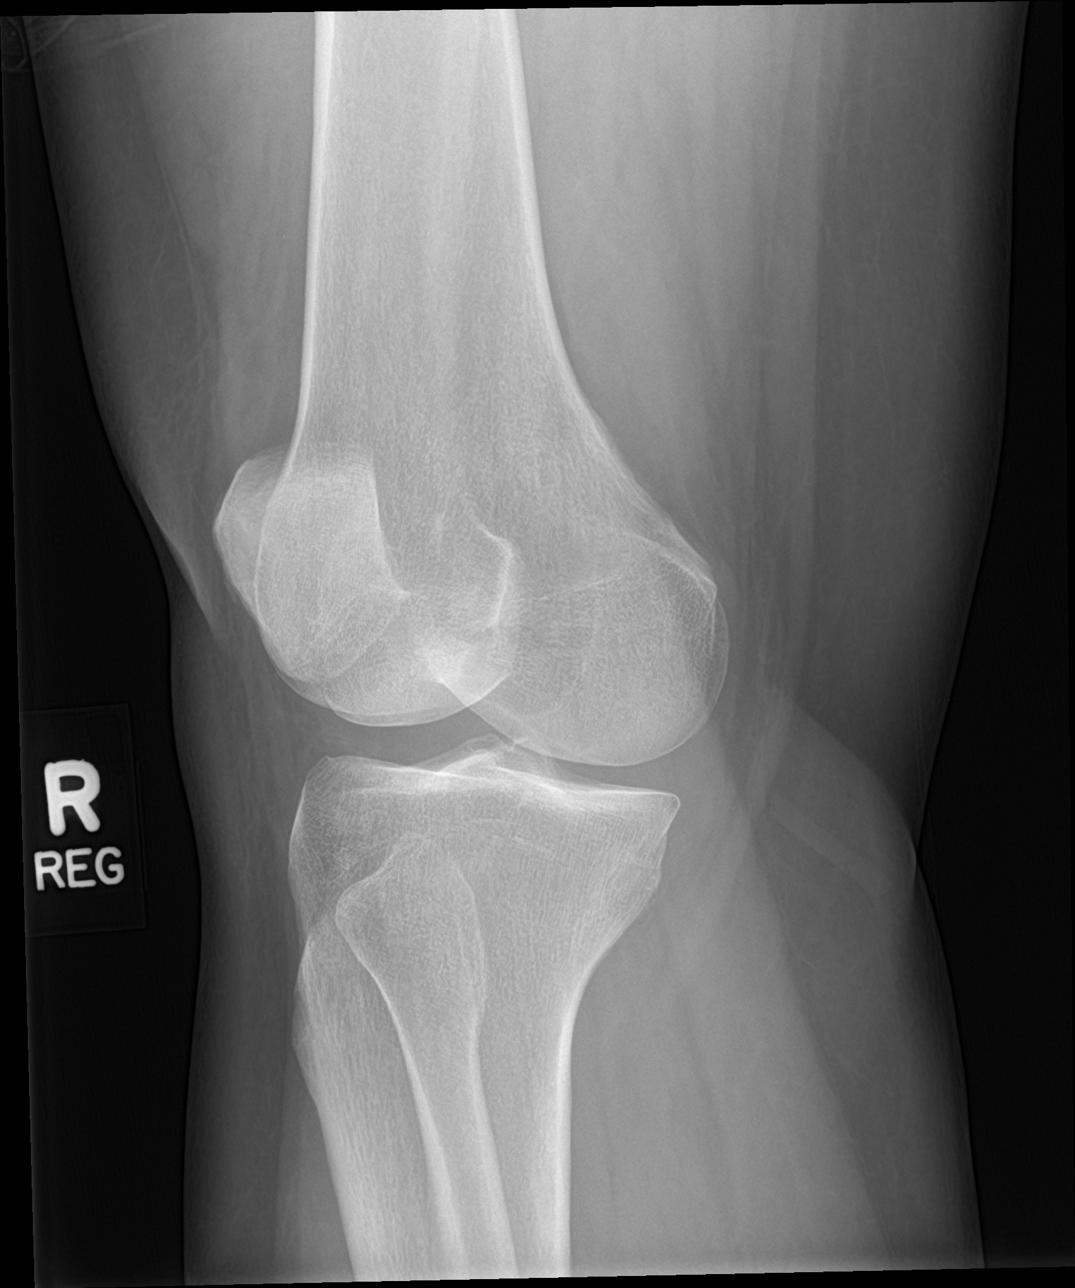

[4 of 4 positions shown; findings below may reference images not displayed]

FINDINGS: No fracture of the proximal tibia or distal femur. Patella is
normal. No joint effusion.
IMPRESSION: No fracture or dislocation.  For

## 2018-07-14 ENCOUNTER — Ambulatory Visit (INDEPENDENT_AMBULATORY_CARE_PROVIDER_SITE_OTHER): Payer: 59 | Admitting: Gynecology

## 2018-07-14 ENCOUNTER — Encounter: Payer: Self-pay | Admitting: Gynecology

## 2018-07-14 VITALS — BP 122/78 | Ht 71.0 in | Wt 237.0 lb

## 2018-07-14 DIAGNOSIS — R87810 Cervical high risk human papillomavirus (HPV) DNA test positive: Secondary | ICD-10-CM | POA: Diagnosis not present

## 2018-07-14 DIAGNOSIS — N926 Irregular menstruation, unspecified: Secondary | ICD-10-CM | POA: Diagnosis not present

## 2018-07-14 DIAGNOSIS — Z01419 Encounter for gynecological examination (general) (routine) without abnormal findings: Secondary | ICD-10-CM | POA: Diagnosis not present

## 2018-07-14 NOTE — Addendum Note (Signed)
Addended by: Dayna Barker on: 07/14/2018 04:45 PM   Modules accepted: Orders

## 2018-07-14 NOTE — Patient Instructions (Signed)
Follow-up for the ultrasound as scheduled. 

## 2018-07-14 NOTE — Progress Notes (Signed)
    Courtney Warren Oct 19, 1970 [id number]        48 y.o.  [id number] new patient for annual gynecologic exam.  Also complaining of:  1. Irregular bleeding.  On NuvaRing contraception with sporadic bleeding intermittently.  Had used Mirena IUDs in the past.  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 2. Pap smear 11/2016 showed normal cytology but positive high risk HPV.  No history of that before.  Past medical history,surgical history, problem list, medications, allergies, family history and social history were all reviewed and documented as reviewed in the EPIC chart.  ROS:  Performed with pertinent positives and negatives included in the history, assessment and plan.   Additional significant findings : None   Exam: Courtney Warren assistant Vitals:   07/14/18 1514  BP: 122/78  Weight: 237 lb (107.5 kg)  Height: 5\' 11"  (1.803 m)   Body mass index is 33.05 kg/m.  General appearance:  Normal affect, orientation and appearance. Skin: Grossly normal HEENT: Without gross lesions.  No cervical or supraclavicular adenopathy. Thyroid normal.  Lungs:  Clear without wheezing, rales or rhonchi Cardiac: RR, without RMG Abdominal:  Soft, nontender, without masses, guarding, rebound, organomegaly or hernia Breasts:  Examined lying and sitting without masses, retractions, discharge or axillary adenopathy. Pelvic:  Ext, BUS, Vagina: Normal  Cervix: Normal.  Pap smear/HPV  Uterus: Grossly normal size midline mobile nontender  Adnexa: Without masses or tenderness    Anus and perineum: Normal   Rectovaginal: Normal sphincter tone without palpated masses or tenderness.    Assessment/Plan:  48 y.o. [id number] female for annual gynecologic exam.  With irregular menses on NuvaRing contraception  1. 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  Hemoglobin this past May was 12.9.   Recommend start with sonohysterogram for cavity assessment.  Discussed various possibilities for control of the irregular bleeding to include switching to a different contraception/menstrual suppression.  Possible continuous oral contraceptives.  We discussed the risks of hormonal contraception to include an increased risk of thrombosis.  She does have hemophilia a which puts her at a higher bleeding risk.  We discussed possible surgeries in the issues of factor VIII replacement and transfusion risks.  Patient will follow-up for the sonohysterogram and then will go from there. 2. 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  Pap smear/HPV now.  Patient will follow-up for results.  If persistent then consider colposcopy. 3. Mammography 06/2018.  Continue with annual mammography next year.  Breast exam normal today. 4. Health maintenance.  No routine lab work done today as patient does this elsewhere.  Follow-up for ultrasound as scheduled.   Dara Lords MD, 4:01 PM 07/14/2018

## 2018-07-16 LAB — PAP IG AND HPV HIGH-RISK: HPV DNA High Risk: NOT DETECTED

## 2018-07-31 ENCOUNTER — Other Ambulatory Visit: Payer: 59

## 2018-07-31 ENCOUNTER — Ambulatory Visit: Payer: 59 | Admitting: Gynecology

## 2019-03-03 ENCOUNTER — Encounter: Payer: Self-pay | Admitting: Gynecology

## 2019-04-17 ENCOUNTER — Other Ambulatory Visit: Payer: Self-pay

## 2019-04-17 DIAGNOSIS — Z20822 Contact with and (suspected) exposure to covid-19: Secondary | ICD-10-CM

## 2019-04-18 LAB — NOVEL CORONAVIRUS, NAA: SARS-CoV-2, NAA: NOT DETECTED

## 2019-08-15 ENCOUNTER — Ambulatory Visit: Payer: Managed Care, Other (non HMO) | Attending: Internal Medicine

## 2019-08-15 DIAGNOSIS — Z23 Encounter for immunization: Secondary | ICD-10-CM | POA: Insufficient documentation

## 2019-08-15 NOTE — Progress Notes (Signed)
   Covid-19 Vaccination Clinic  Name:  Courtney Warren    MRN: [id number] DOB: Jun 24, 1970  08/15/2019  [name] was observed post Covid-19 immunization for 15 minutes without incident. She was provided with Vaccine Information Sheet and instruction to access the V-Safe system.   [name] was instructed to call 911 with any severe reactions post vaccine: Marland Kitchen Difficulty breathing  . Swelling of face and throat  . A fast heartbeat  . A bad rash all over body  . Dizziness and weakness   Immunizations Administered    Name Date Dose VIS Date Route   Pfizer COVID-19 Vaccine 08/15/2019  6:01 PM 0.3 mL 05/22/2019 Intramuscular   Manufacturer: [employer], [employer]   Lot: [id number]   NDC: [address]-4

## 2019-09-05 ENCOUNTER — Ambulatory Visit: Payer: Managed Care, Other (non HMO) | Attending: Internal Medicine

## 2019-09-05 DIAGNOSIS — Z23 Encounter for immunization: Secondary | ICD-10-CM

## 2019-09-05 NOTE — Progress Notes (Signed)
   Covid-19 Vaccination Clinic  Name:  Courtney Warren    MRN: [id number] DOB: 1970/07/10  09/05/2019  [name] was observed post Covid-19 immunization for 15 minutes without incident. She was provided with Vaccine Information Sheet and instruction to access the V-Safe system.   [name] was instructed to call 911 with any severe reactions post vaccine: Marland Kitchen Difficulty breathing  . Swelling of face and throat  . A fast heartbeat  . A bad rash all over body  . Dizziness and weakness   Immunizations Administered    Name Date Dose VIS Date Route   Pfizer COVID-19 Vaccine 09/05/2019  3:37 PM 0.3 mL 05/22/2019 Intramuscular   Manufacturer: [employer], [employer]   Lot: [id number]   NDC: [address]-7

## 2022-04-28 LAB — COLOGUARD: COLOGUARD: NEGATIVE

## 2022-08-01 ENCOUNTER — Other Ambulatory Visit: Payer: Self-pay

## 2022-08-01 ENCOUNTER — Encounter (HOSPITAL_BASED_OUTPATIENT_CLINIC_OR_DEPARTMENT_OTHER): Payer: Self-pay

## 2022-08-01 ENCOUNTER — Emergency Department (HOSPITAL_BASED_OUTPATIENT_CLINIC_OR_DEPARTMENT_OTHER): Payer: 59 | Admitting: Radiology

## 2022-08-01 DIAGNOSIS — Z20822 Contact with and (suspected) exposure to covid-19: Secondary | ICD-10-CM | POA: Insufficient documentation

## 2022-08-01 DIAGNOSIS — J101 Influenza due to other identified influenza virus with other respiratory manifestations: Secondary | ICD-10-CM | POA: Diagnosis not present

## 2022-08-01 DIAGNOSIS — R059 Cough, unspecified: Secondary | ICD-10-CM | POA: Diagnosis present

## 2022-08-01 LAB — GROUP A STREP BY PCR: Group A Strep by PCR: NOT DETECTED

## 2022-08-01 LAB — RESP PANEL BY RT-PCR (RSV, FLU A&B, COVID)  RVPGX2
Influenza A by PCR: NEGATIVE
Influenza B by PCR: POSITIVE — AB
Resp Syncytial Virus by PCR: NEGATIVE
SARS Coronavirus 2 by RT PCR: NEGATIVE

## 2022-08-01 NOTE — ED Triage Notes (Signed)
Patient here POV from Home.  Endorses Nausea, Sore throat, Fever, Mildly Productive Cough, Fatigue that began Yesterday PM.  Tested today for Flu and COVID-19 which was Negative.   NAD Noted during Triage. A&Ox4. GCS 15. Ambulatory.

## 2022-08-02 ENCOUNTER — Emergency Department (HOSPITAL_BASED_OUTPATIENT_CLINIC_OR_DEPARTMENT_OTHER)
Admission: EM | Admit: 2022-08-02 | Discharge: 2022-08-02 | Disposition: A | Discharge status: Home / Self Care | Payer: 59 | Attending: Emergency Medicine | Admitting: Emergency Medicine

## 2022-08-02 DIAGNOSIS — J101 Influenza due to other identified influenza virus with other respiratory manifestations: Secondary | ICD-10-CM

## 2022-08-02 MED ORDER — IBUPROFEN 800 MG PO TABS
800.0000 mg | ORAL_TABLET | Freq: Once | ORAL | Status: AC
  Administered 2022-08-02: 800 mg via ORAL
  Filled 2022-08-02: qty 1

## 2022-08-02 MED ORDER — ONDANSETRON 4 MG PO TBDP: ORAL_TABLET | ORAL | 0 refills | Status: DC

## 2022-08-02 NOTE — Discharge Instructions (Signed)
Take tylenol 2 pills 4 times a day and motrin 4 pills 3 times a day.  Drink plenty of fluids.  Return for worsening shortness of breath, headache, confusion. Follow up with your family doctor.   

## 2022-08-02 NOTE — ED Notes (Signed)
Pt reports generalized body aches and intermittent chills.  Given snack and ice water and ginger ale, po intake encouraged

## 2022-08-02 NOTE — ED Provider Notes (Signed)
[facility] Provider Note   CSN: [id number] Arrival date & time: 08/01/22  2025     History  Chief Complaint  Patient presents with   Cough    Courtney Warren is a 52 y.o. female.  52 yo F with a cc of cough congestion, fevers chills myalgias.  Started suddenly yesterday.  Seen at urgent care, started on tamiflu, sudafed.  Now having diarrhea.     Cough      Home Medications Prior to Admission medications   Medication Sig Start Date End Date Taking? Authorizing Provider  ondansetron (ZOFRAN-ODT) 4 MG disintegrating tablet 48m ODT q4 hours prn nausea/vomit 08/02/22  Yes FDeno Etienne DO  etonogestrel-ethinyl estradiol (NUVARING) 0.12-0.015 MG/24HR vaginal ring Place 1 each vaginally every 28 (twenty-eight) days. Insert vaginally and leave in place for 3 consecutive weeks, then remove for 1 week.    [provider]  gabapentin (NEURONTIN) 300 MG capsule Take 300 mg by mouth daily.    [provider]      Allergies    Tramadol    Review of Systems   Review of Systems  Respiratory:  Positive for cough.     Physical Exam Updated Vital Signs BP (!) 136/99   Pulse 95   Temp 98.6 F (37 C) (Oral)   Resp 16   Ht 5' 10"$  (1.778 m)   Wt 99.8 kg   SpO2 98%   BMI 31.57 kg/m  Physical Exam Vitals and nursing note reviewed.  Constitutional:      General: She is not in acute distress.    Appearance: She is well-developed. She is not diaphoretic.  HENT:     Head: Normocephalic and atraumatic.     Comments: Swollen turbinates, posterior nasal drip, no noted sinus ttp, tm normal bilaterally.   Eyes:     Pupils: Pupils are equal, round, and reactive to light.  Cardiovascular:     Rate and Rhythm: Normal rate and regular rhythm.     Heart sounds: No murmur heard.    No friction rub. No gallop.  Pulmonary:     Effort: Pulmonary effort is normal.     Breath sounds: No wheezing or rales.  Abdominal:     General:  There is no distension.     Palpations: Abdomen is soft.     Tenderness: There is no abdominal tenderness.  Musculoskeletal:        General: No tenderness.     Cervical back: Normal range of motion and neck supple.  Skin:    General: Skin is warm and dry.  Neurological:     Mental Status: She is alert and oriented to person, place, and time.  Psychiatric:        Behavior: Behavior normal.     ED Results / Procedures / Treatments   Labs (all labs ordered are listed, but only abnormal results are displayed) Labs Reviewed  RESP PANEL BY RT-PCR (RSV, FLU A&B, COVID)  RVPGX2 - Abnormal; Notable for the following components:      Result Value   Influenza B by PCR POSITIVE (*)    All other components within normal limits  GROUP A STREP BY PCR    EKG None  Radiology DG Chest 2 View  Result Date: 08/01/2022 CLINICAL DATA:  Cough.  Nausea and sore throat. EXAM: CHEST - 2 VIEW COMPARISON:  None Available. FINDINGS: Cardiac silhouette and mediastinal contours are within normal limits. The lungs are clear. No pleural effusion  or pneumothorax. Mild-to-moderate multilevel degenerative disc changes of the upper thoracic spine. IMPRESSION: No active cardiopulmonary disease. Electronically Signed   By: Yvonne Kendall M.D.   On: 08/01/2022 21:14    Procedures Procedures    Medications Ordered in ED Medications  ibuprofen (ADVIL) tablet 800 mg (800 mg Oral Given 08/02/22 0040)    ED Course/ Medical Decision Making/ A&P                             Medical Decision Making Amount and/or Complexity of Data Reviewed Radiology: ordered.  Risk Prescription drug management.   52 yo F with a cc of a viral syndrome.  Going on since yesterday afternoon.  CXR negative for focal infiltrate or pneumothorax.  Flu B+.  D/c home.  1:25 AM:  I have discussed the diagnosis/risks/treatment options with the patient.  Evaluation and diagnostic testing in the emergency department does not suggest an  emergent condition requiring admission or immediate intervention beyond what has been performed at this time.  They will follow up with PCP. We also discussed returning to the ED immediately if new or worsening sx occur. We discussed the sx which are most concerning (e.g., sudden worsening pain, fever, inability to tolerate by mouth) that necessitate immediate return. Medications administered to the patient during their visit and any new prescriptions provided to the patient are listed below.  Medications given during this visit Medications  ibuprofen (ADVIL) tablet 800 mg (800 mg Oral Given 08/02/22 0040)     The patient appears reasonably screen and/or stabilized for discharge and I doubt any other medical condition or other [facility] requiring further screening, evaluation, or treatment in the ED at this time prior to discharge.          Final Clinical Impression(s) / ED Diagnoses Final diagnoses:  Influenza B    Rx / DC Orders ED Discharge Orders          Ordered    ondansetron (ZOFRAN-ODT) 4 MG disintegrating tablet        08/02/22 0054              Deno Etienne, DO 08/02/22 0125

## 2022-09-22 ENCOUNTER — Encounter: Payer: Self-pay | Admitting: *Deleted

## 2022-09-22 LAB — AMB RESULTS CONSOLE CBG: Glucose: 134

## 2022-09-28 ENCOUNTER — Encounter: Payer: Self-pay | Admitting: *Deleted

## 2022-09-28 NOTE — Progress Notes (Signed)
Pt attended 09/22/22 screening event where her b/p was 130/90; pt given her b/p results to share with her PCP, who pt confirmed at the event, is Courtney Brunswick, NP, and whom the pt has seen on an ongoing basis over the past rolling 12 months. Pt did not identify any SDOH insecurities at the event. Chart review indicates pt also has had ongoing access to ortho and gyn care. No additional health equity team support indicated at this time

## 2022-10-15 ENCOUNTER — Other Ambulatory Visit (HOSPITAL_COMMUNITY)
Admission: RE | Admit: 2022-10-15 | Discharge: 2022-10-15 | Disposition: A | Discharge status: Home / Self Care | Payer: 59 | Source: Ambulatory Visit | Attending: Nurse Practitioner | Admitting: Nurse Practitioner

## 2022-10-15 ENCOUNTER — Ambulatory Visit (INDEPENDENT_AMBULATORY_CARE_PROVIDER_SITE_OTHER): Payer: 59 | Admitting: Nurse Practitioner

## 2022-10-15 ENCOUNTER — Encounter: Payer: Self-pay | Admitting: Nurse Practitioner

## 2022-10-15 VITALS — BP 122/82 | HR 90 | Ht 70.25 in | Wt 228.0 lb

## 2022-10-15 DIAGNOSIS — N951 Menopausal and female climacteric states: Secondary | ICD-10-CM

## 2022-10-15 DIAGNOSIS — Z124 Encounter for screening for malignant neoplasm of cervix: Secondary | ICD-10-CM | POA: Diagnosis present

## 2022-10-15 DIAGNOSIS — Z01419 Encounter for gynecological examination (general) (routine) without abnormal findings: Secondary | ICD-10-CM

## 2022-10-15 DIAGNOSIS — Z3041 Encounter for surveillance of contraceptive pills: Secondary | ICD-10-CM

## 2022-10-15 MED ORDER — NORETHIN ACE-ETH ESTRAD-FE 1-20 MG-MCG PO TABS
1.0000 | ORAL_TABLET | Freq: Every day | ORAL | 3 refills | Status: DC
Start: 2022-10-15 — End: 2023-11-18

## 2022-10-15 NOTE — Progress Notes (Signed)
   Courtney Warren 07/15/1970 [id number]   History:  52 y.o. [id number] presents as new patient to establish care. Has started skipping cycles. LMP 3 months ago. On COCs. Would like to suppress menses. Normal pap history. H/O Factor VIII deficiency, celiac disease.   Gynecologic History Patient's last menstrual period was 06/15/2022. Period Duration (Days): 5-6 Period Pattern: (!) Irregular Menstrual Flow: Moderate Menstrual Control: Maxi pad, Tampon Dysmenorrhea: (!) Moderate Contraception/Family planning: OCP (estrogen/progesterone) Sexually active: Yes  Health Maintenance Last Pap: 07/14/2018. Results were: Normal neg HPV Last mammogram: 11/30/2021. Results were: Normal Last colonoscopy: Never. Negative Cologuard 04/2022 Last Dexa: Not indicated  Past medical history, past surgical history, family history and social history were all reviewed and documented in the EPIC chart. Boyfriend. Works for [employer]. 3 sons, all local. 2 grandsons.   ROS:  A ROS was performed and pertinent positives and negatives are included.  Exam:  Vitals:   10/15/22 1139  BP: 122/82  Pulse: 90  SpO2: 97%  Weight: 228 lb (103.4 kg)  Height: 5' 10.25" (1.784 m)   Body mass index is 32.48 kg/m.  General appearance:  Normal Thyroid:  Symmetrical, normal in size, without palpable masses or nodularity. Respiratory  Auscultation:  Clear without wheezing or rhonchi Cardiovascular  Auscultation:  Regular rate, without rubs, murmurs or gallops  Edema/varicosities:  Not grossly evident Abdominal  Soft,nontender, without masses, guarding or rebound.  Liver/spleen:  No organomegaly noted  Hernia:  None appreciated  Skin  Inspection:  Grossly normal Breasts: Examined lying and sitting.   Right: Without masses, retractions, nipple discharge or axillary adenopathy.   Left: Without masses, retractions, nipple discharge or axillary adenopathy. Genitourinary   Inguinal/mons:  Normal without inguinal  adenopathy  External genitalia:  Normal appearing vulva with no masses, tenderness, or lesions  BUS/Urethra/Skene's glands:  Normal  Vagina:  Normal appearing with normal color and discharge, no lesions  Cervix:  Normal appearing without discharge or lesions  Uterus:  Normal in size, shape and contour.  Midline and mobile, nontender  Adnexa/parametria:     Rt: Normal in size, without masses or tenderness.   Lt: Normal in size, without masses or tenderness.  Anus and perineum: Normal  Digital rectal exam: Deferred  Patient informed chaperone available to be present for breast and pelvic exam. Patient has requested no chaperone to be present. Patient has been advised what will be completed during breast and pelvic exam.   Assessment/Plan:  52 y.o. [id number] to establish care.   Well female exam with routine gynecological exam - Education provided on SBEs, importance of preventative screenings, current guidelines, high calcium diet, regular exercise, and multivitamin daily.  Labs with PCP.   Screening for cervical cancer - Plan: Cytology - PAP( [facility]). Normal pap history.   Encounter for surveillance of contraceptive pills - Plan: norethindrone-ethinyl estradiol-FE (LOESTRIN FE) 1-20 MG-MCG tablet daily. Would like to suppress menses. Take active pills only, skipping placebo pills. Refill x 1 year provided.   Perimenopause - Mild menopausal symptoms. Skipping menses. LMP 3 months ago. COCs. Discussed what to expect during transition.   Screening for breast cancer - Normal mammogram history.  Continue annual screenings.  Normal breast exam today.  Screening for colon cancer - Negative Cologuard 04/2022. Repeat at 3-year interval per recommendation.    Return in 1 year for annual.     Olivia Mackie DNP, 12:07 PM 10/15/2022

## 2022-10-17 LAB — CYTOLOGY - PAP
Comment: NEGATIVE
Diagnosis: NEGATIVE
High risk HPV: NEGATIVE

## 2023-11-17 ENCOUNTER — Other Ambulatory Visit: Payer: Self-pay | Admitting: Nurse Practitioner

## 2023-11-17 DIAGNOSIS — Z3041 Encounter for surveillance of contraceptive pills: Secondary | ICD-10-CM

## 2023-11-18 NOTE — Telephone Encounter (Signed)
 Med refill request: [facility] Last AEX: 10/15/2022-TW Next AEX: recall sent per EMR. Nothing scheduled yet. Msg sent to appt desk.  Last MMG (if hormonal med): 04/15/2023-WNL Refill authorized: rx pend.

## 2024-02-18 ENCOUNTER — Ambulatory Visit: Payer: Self-pay | Admitting: Nurse Practitioner

## 2024-02-18 NOTE — Progress Notes (Deleted)
   Courtney Warren 1971-01-23 [id number]   History:  53 y.o. [id number] presents for annual exam. Has started skipping cycles. LMP 3 months ago. On COCs. Would like to suppress menses. Normal pap history. H/O Factor VIII deficiency, celiac disease.   Gynecologic History No LMP recorded.   Contraception/Family planning: OCP (estrogen/progesterone) Sexually active: Yes  Health Maintenance Last Pap: 10/15/2022. Results were: Normal neg HPV Last mammogram: 11/30/2021. Results were: Normal Last colonoscopy: Never. Negative Cologuard 04/2022 Last Dexa: Not indicated      No data to display           Past medical history, past surgical history, family history and social history were all reviewed and documented in the EPIC chart. Boyfriend. Works for [employer]. 3 sons, all local. 2 grandsons.   ROS:  A ROS was performed and pertinent positives and negatives are included.  Exam:  There were no vitals filed for this visit.  There is no height or weight on file to calculate BMI.  General appearance:  Normal Thyroid:  Symmetrical, normal in size, without palpable masses or nodularity. Respiratory  Auscultation:  Clear without wheezing or rhonchi Cardiovascular  Auscultation:  Regular rate, without rubs, murmurs or gallops  Edema/varicosities:  Not grossly evident Abdominal  Soft,nontender, without masses, guarding or rebound.  Liver/spleen:  No organomegaly noted  Hernia:  None appreciated  Skin  Inspection:  Grossly normal Breasts: Examined lying and sitting.   Right: Without masses, retractions, nipple discharge or axillary adenopathy.   Left: Without masses, retractions, nipple discharge or axillary adenopathy. Pelvic: External genitalia:  no lesions              Urethra:  normal appearing urethra with no masses, tenderness or lesions              Bartholins and Skenes: normal                 Vagina: normal appearing vagina with normal color and discharge, no lesions               Cervix: no lesions Bimanual Exam:  Uterus:  no masses or tenderness              Adnexa: no mass, fullness, tenderness              Rectovaginal: Deferred              Anus:  normal, no lesions  Assessment/Plan:  53 y.o. [id number] to establish care.   Well female exam with routine gynecological exam - Education provided on SBEs, importance of preventative screenings, current guidelines, high calcium diet, regular exercise, and multivitamin daily.  Labs with PCP.   Screening for cervical cancer - Normal pap history. Will repeat at 5-year interval per guidelines.   Encounter for surveillance of contraceptive pills - Plan: norethindrone-ethinyl estradiol-FE (LOESTRIN FE) 1-20 MG-MCG tablet daily. Would like to suppress menses. Take active pills only, skipping placebo pills. Refill x 1 year provided.   Perimenopause - Mild menopausal symptoms. Skipping menses. LMP 3 months ago. COCs. Discussed what to expect during transition.   Screening for breast cancer - Normal mammogram history.  Continue annual screenings.  Normal breast exam today.  Screening for colon cancer - Negative Cologuard 04/2022. Repeat at 3-year interval per recommendation.    No follow-ups on file.     Courtney DELENA Shutter DNP, 8:31 AM 02/18/2024

## 2024-03-04 ENCOUNTER — Other Ambulatory Visit: Payer: Self-pay | Admitting: Nurse Practitioner

## 2024-03-04 DIAGNOSIS — Z3041 Encounter for surveillance of contraceptive pills: Secondary | ICD-10-CM

## 2024-03-04 NOTE — Telephone Encounter (Signed)
 Med refill request:  norethindrone-ethinyl estradiol-FE (JUNEL FE 1/20) 1-20 MG-MCG tablet Start: 11/18/2023   Pharmacy: [id number] - [place], [place] -   *Note to Pharmacy: Needs exam for future refills  .SABRASABRAFront desk has been advised to contact Pt to schedule an exam  Last AEX:  10/15/22 Next AEX:  Not yet scheduled Last MMG (if hormonal med):  2013
